# Patient Record
Sex: Female | Born: 1970 | Race: Black or African American | Hispanic: No | Marital: Married | Smoking: Current every day smoker
Health system: Southern US, Community
[De-identification: ages and names within clinical notes are randomized; demographics above are authoritative.]

## PROBLEM LIST (undated history)

## (undated) DIAGNOSIS — R55 Syncope and collapse: Secondary | ICD-10-CM

## (undated) DIAGNOSIS — E7800 Pure hypercholesterolemia, unspecified: Secondary | ICD-10-CM

## (undated) DIAGNOSIS — I10 Essential (primary) hypertension: Secondary | ICD-10-CM

---

## 1997-05-01 ENCOUNTER — Other Ambulatory Visit: Admission: RE | Admit: 1997-05-01 | Discharge: 1997-05-01 | Payer: Self-pay | Admitting: Obstetrics and Gynecology

## 1997-05-01 ENCOUNTER — Other Ambulatory Visit: Admission: RE | Admit: 1997-05-01 | Discharge: 1997-05-01 | Payer: Self-pay | Admitting: Gynecology

## 1997-05-24 ENCOUNTER — Other Ambulatory Visit: Admission: RE | Admit: 1997-05-24 | Discharge: 1997-05-24 | Payer: Self-pay | Admitting: Obstetrics & Gynecology

## 1998-01-02 ENCOUNTER — Ambulatory Visit (HOSPITAL_COMMUNITY): Admission: RE | Admit: 1998-01-02 | Discharge: 1998-01-02 | Payer: Self-pay | Admitting: Obstetrics and Gynecology

## 1998-01-04 ENCOUNTER — Ambulatory Visit (HOSPITAL_COMMUNITY): Admission: RE | Admit: 1998-01-04 | Discharge: 1998-01-04 | Payer: Self-pay | Admitting: Obstetrics and Gynecology

## 2003-01-22 ENCOUNTER — Emergency Department (HOSPITAL_COMMUNITY): Admission: EM | Admit: 2003-01-22 | Discharge: 2003-01-23 | Payer: Self-pay | Admitting: Emergency Medicine

## 2005-05-05 ENCOUNTER — Observation Stay (HOSPITAL_COMMUNITY): Admission: AD | Admit: 2005-05-05 | Discharge: 2005-05-06 | Payer: Self-pay | Admitting: Obstetrics and Gynecology

## 2005-12-07 ENCOUNTER — Inpatient Hospital Stay (HOSPITAL_COMMUNITY): Admission: AD | Admit: 2005-12-07 | Discharge: 2005-12-09 | Payer: Self-pay | Admitting: Obstetrics and Gynecology

## 2007-05-16 ENCOUNTER — Other Ambulatory Visit: Admission: RE | Admit: 2007-05-16 | Discharge: 2007-05-16 | Payer: Self-pay | Admitting: Gynecology

## 2009-05-01 ENCOUNTER — Ambulatory Visit (HOSPITAL_COMMUNITY): Admission: RE | Admit: 2009-05-01 | Discharge: 2009-05-01 | Payer: Self-pay | Admitting: Family Medicine

## 2010-01-28 ENCOUNTER — Ambulatory Visit: Admit: 2010-01-28 | Payer: Self-pay | Admitting: Obstetrics and Gynecology

## 2010-06-12 NOTE — H&P (Signed)
NAME:  Rachel, [name] NO.:  [id number]   MEDICAL RECORD NO.:  [id number]           PATIENT TYPE:  INP   LOCATION:  LDR4                          FACILITY:  [facility]   PHYSICIAN:  Tilda Burrow, M.D. DATE OF BIRTH:  08-21-1970   DATE OF ADMISSION:  DATE OF DISCHARGE:  LH                                HISTORY & PHYSICAL   CHIEF COMPLAINT:  Induction of labor secondary to maternal request due to  severe discomfort and cervical favorability.   HISTORY OF PRESENT ILLNESS:  Rachel [name] is a 40 year [name] gravida 5, para 1, AB 3,  with an EDC of December 07, 2005, based on two correlating first and second  trimester ultrasounds.  She began prenatal care in her first trimester and  has had regular visits since then.  Prenatal course was complicated by  severe hyperemesis gravidarum in the first trimester, losing 10% of her body  weight in the first few weeks.  That has since resolved and she has had a  total weight gain of 20 pounds throughout the pregnancy with appropriate  fundal height growth.  She did have a level II ultrasound at [facility]  for a question about the heart.  However, that was deemed to be within  normal limits.  Prenatal labs include blood type B+, rubella immune.  HBSAG,  HIV, RPR, gonorrhea, chlamydia and Group B strep are all negative.  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  Sickle screen was also negative.  Blood pressures have been 100 to 140s over  50s to 80s.   PAST MEDICAL HISTORY:  Noncontributory.   SURGICAL HISTORY:  D&C x2.   No known drug allergies.   No medications at this time.   SOCIAL HISTORY:  Is married.  Works for [employer].  Denies alcohol  or drug use.  Does admit to smoking a few cigarettes a day.   FAMILY HISTORY:  Positive for diabetes, hypertension and prostate cancer.   PHYSICAL EXAMINATION:  HEENT: Within normal limits.  HEART:  Regular rate and rhythm.  LUNGS:   Clear.  ABDOMEN:  Soft and nontender.  Fundal height about 40 cm.  Estimated fetal  weight about 7-1/2 to 8 pounds.  PELVIC:  Cervical exam 1-2 cm, 50% effaced, -2 station, vertex presentation.  EXTREMITIES:  Legs are negative.   IMPRESSION:  Intrauterine pregnancy at 40 weeks, elective induction of  labor.   PLAN:  Foley preinduction this evening with AROM and Pitocin in the morning.      Jacklyn Shell, C.N.M.      Tilda Burrow, M.D.  Electronically Signed    FC/MEDQ  D:  12/07/2005  T:  12/07/2005  Job:  [id number]

## 2010-06-12 NOTE — H&P (Signed)
[name], [name]               ACCOUNT NO.:  [id number]   MEDICAL RECORD NO.:  [id number]           PATIENT TYPE:  OIB   LOCATION:  A417                          FACILITY:  [facility]   PHYSICIAN:  Tilda Burrow, M.D. DATE OF BIRTH:  10-16-1970   DATE OF ADMISSION:  05/05/2005  DATE OF DISCHARGE:  LH                                HISTORY & PHYSICAL   REASON FOR ADMISSION:  Pregnancy at 9 weeks with severe nausea and vomiting  and dehydration.   HISTORY OF PRESENT ILLNESS:  Rachel [name] is a 40 year old Gravida 5, para 1,  abortus 3 who is in today for her Pap smear but complains of severe nausea  and vomiting.  She is down one pound from her last visit one week ago.  She  has 1+ protein, 2+ blood in her urine.  She is not able to tolerate anything  at the present time.   PAST MEDICAL HISTORY:  Medical history is positive for psoriasis.   PAST SURGICAL HISTORY:  Surgical history is positive for D&C x2.   ALLERGIES:  She has no known allergies.   FAMILY HISTORY:  Positive for diabetes, hypertension.   PHYSICAL EXAMINATION:  VITAL SIGNS:  Weight is 117 pounds, blood pressure  110/60.  There is 1+ protein and 2+ blood in her urine.  SPECULUM EXAM:  Cervix is closed.  No bleeding noted.   LABORATORY DATA:  Blood type is B positive.  Rubella is immune.  Initial  hemoglobin 12.6, initial WBC 6.8.  Hepatitis B surface antigen is negative.  HIV is negative.  Serologies are nonreactive.   PLAN:  1.  We are going to admit.  2.  IV hydration.  3.  Antiemetics.  4.  Probable discharge home in the morning if no complications.      Zerita Boers, Lanier Clam      Tilda Burrow, M.D.  Electronically Signed    DL/MEDQ  D:  16/10/9602  T:  05/05/2005  Job:  [id number]

## 2010-06-12 NOTE — Op Note (Signed)
[name], [name]               ACCOUNT NO.:  [id number]   MEDICAL RECORD NO.:  [id number]          PATIENT TYPE:  INP   LOCATION:  A402                          FACILITY:  [facility]   PHYSICIAN:  Lazaro Arms, M.D.   DATE OF BIRTH:  05/25/1970   DATE OF PROCEDURE:  02/10/2006  DATE OF DISCHARGE:  12/09/2005                               OPERATIVE REPORT   PROCEDURE:  Epidural placement.   Rachel [name] is a gravida 5, para 1, abortus 3 at [redacted] weeks gestation who  presented in spontaneous labor.  She has had Nubain and Phenergan and is  requesting an epidural be placed.   The patient is placed in the sitting position.  Betadine prep is used.  1% Lidocaine is injected in the L3-L4 interspace.  The area is sterilely  draped.  A 17 gauge Tuohy needle is used.  Loss of resistance technique  employed and the epidural space is found with one pass without  difficulty.  Ten mL of 0.125% bupivacaine plain is given into the  epidural space and the epidural catheter is then fed and taken down 5 cm  from the epidural space.  Continuous infusion is begun at 12 mL an hour  of 0.125% bupivacaine with 2 mcg per mL of Fentanyl.  The patient  tolerated the procedure well.  She is experiencing good pain relief.  Fetal heart rate tracing stable and blood pressure is stable.      Lazaro Arms, M.D.  Electronically Signed     LHE/MEDQ  D:  02/10/2006  T:  02/10/2006  Job:  [id number]

## 2010-06-12 NOTE — Group Therapy Note (Signed)
NAME:  ASEES, MANFREDI NO.:  [id number]   MEDICAL RECORD NO.:  [id number]          PATIENT TYPE:  INP   LOCATION:  LDR4                          FACILITY:  [facility]   PHYSICIAN:  Lazaro Arms, M.D.   DATE OF BIRTH:  [birth year]   DATE OF PROCEDURE:  DATE OF DISCHARGE:                                   PROGRESS NOTE   PROGRESS NOTE:  Pat progressed nicely through labor without Pitocin  augmentation and was noted be fully dilated and ready to push at  approximately 3:20.  She was having variable decels during each push;  however, returned to baseline with good variability in between contractions.  She pushed for about 20-30 minutes and had a spontaneous vaginal delivery of  a viable female infant at 13.  The mouth and nose were suctioned  thoroughly on the perineum with a DeLee suction.  The body delivered without  difficulty.  There was not a nuchal cord.  She was taken directed to the  warmer without stimulation and was suctioned endotracheally times with a 3.0  ET tube, collecting about 3 mL of meconium-stained fluid.  At that point the  baby had started to cry so DeLee suction was performed once more and regular  newborn care was given.  The baby does have what appears to be a external  pilonidal cyst versus open neural tube defect.  Dr. Milford Cage was notified and  care is being assumed by him.  The baby does move all of its lower  extremities in a normal fashion.  Apgars are nine and nine.  Weight is 6  pounds 7 ounces.  20 units of Pitocin diluted in 1000 mL of lactated  Ringer's was then infused rapidly IV.  The placenta separated spontaneously  and delivered via controlled cord traction at 0356.  It was inspected and  appears to be intact with a three-vessel cord.  The vagina was inspected and  no lacerations were found.  Estimated blood loss 200 mL.  The epidural  catheter was then removed with the blue tip visualized as being intact.      Jacklyn Shell, C.N.M.      Lazaro Arms, M.D.  Electronically Signed    FC/MEDQ  D:  12/08/2005  T:  12/08/2005  Job:  [id number]   cc:   Francoise Schaumann. Milford Cage DO, FAAP  Fax: [phone]

## 2012-12-19 ENCOUNTER — Other Ambulatory Visit (HOSPITAL_COMMUNITY): Payer: Self-pay | Admitting: Internal Medicine

## 2012-12-19 DIAGNOSIS — E049 Nontoxic goiter, unspecified: Secondary | ICD-10-CM

## 2012-12-26 ENCOUNTER — Ambulatory Visit (HOSPITAL_COMMUNITY): Payer: 59

## 2013-01-29 ENCOUNTER — Ambulatory Visit (HOSPITAL_COMMUNITY): Admission: RE | Admit: 2013-01-29 | Payer: 59 | Source: Ambulatory Visit

## 2016-11-10 ENCOUNTER — Other Ambulatory Visit: Payer: Self-pay | Admitting: Physician Assistant

## 2016-11-10 DIAGNOSIS — Z1231 Encounter for screening mammogram for malignant neoplasm of breast: Secondary | ICD-10-CM

## 2016-11-16 ENCOUNTER — Inpatient Hospital Stay: Admission: RE | Admit: 2016-11-16 | Payer: Self-pay | Source: Ambulatory Visit

## 2017-09-06 ENCOUNTER — Other Ambulatory Visit: Payer: Self-pay

## 2017-09-06 ENCOUNTER — Emergency Department (HOSPITAL_COMMUNITY)
Admission: EM | Admit: 2017-09-06 | Discharge: 2017-09-06 | Disposition: A | Discharge status: Home / Self Care | Payer: 59 | Attending: Emergency Medicine | Admitting: Emergency Medicine

## 2017-09-06 ENCOUNTER — Encounter (HOSPITAL_COMMUNITY): Payer: Self-pay | Admitting: *Deleted

## 2017-09-06 ENCOUNTER — Emergency Department (HOSPITAL_COMMUNITY): Payer: 59

## 2017-09-06 DIAGNOSIS — E876 Hypokalemia: Secondary | ICD-10-CM | POA: Diagnosis not present

## 2017-09-06 DIAGNOSIS — I10 Essential (primary) hypertension: Secondary | ICD-10-CM | POA: Insufficient documentation

## 2017-09-06 DIAGNOSIS — R079 Chest pain, unspecified: Secondary | ICD-10-CM | POA: Insufficient documentation

## 2017-09-06 DIAGNOSIS — F17210 Nicotine dependence, cigarettes, uncomplicated: Secondary | ICD-10-CM | POA: Diagnosis not present

## 2017-09-06 DIAGNOSIS — Z79899 Other long term (current) drug therapy: Secondary | ICD-10-CM | POA: Diagnosis not present

## 2017-09-06 DIAGNOSIS — E7800 Pure hypercholesterolemia, unspecified: Secondary | ICD-10-CM | POA: Insufficient documentation

## 2017-09-06 HISTORY — DX: Pure hypercholesterolemia, unspecified: E78.00

## 2017-09-06 HISTORY — DX: Syncope and collapse: R55

## 2017-09-06 HISTORY — DX: Essential (primary) hypertension: I10

## 2017-09-06 LAB — I-STAT BETA HCG BLOOD, ED (MC, WL, AP ONLY): I-stat hCG, quantitative: <5 m[IU]/mL (ref <5)

## 2017-09-06 LAB — I-STAT TROPONIN, ED
Troponin i, poc: 0 ng/mL (ref 0.00–0.08)
Troponin i, poc: 0.01 ng/mL (ref 0.00–0.08)

## 2017-09-06 LAB — CBC
HCT: 49.5 % — ABNORMAL HIGH (ref 36.0–46.0)
Hemoglobin: 17.2 g/dL — ABNORMAL HIGH (ref 12.0–15.0)
MCH: 33.1 pg (ref 26.0–34.0)
MCHC: 34.7 g/dL (ref 30.0–36.0)
MCV: 95.4 fL (ref 78.0–100.0)
Platelets: 298 10*3/uL (ref 150–400)
RBC: 5.19 MIL/uL — ABNORMAL HIGH (ref 3.87–5.11)
RDW: 12.7 % (ref 11.5–15.5)
WBC: 6.7 10*3/uL (ref 4.0–10.5)

## 2017-09-06 LAB — BASIC METABOLIC PANEL
Anion gap: 12 (ref 5–15)
BUN: 11 mg/dL (ref 6–20)
CO2: 27 mmol/L (ref 22–32)
Calcium: 10.3 mg/dL (ref 8.9–10.3)
Chloride: 98 mmol/L (ref 98–111)
Creatinine, Ser: 1.12 mg/dL — ABNORMAL HIGH (ref 0.44–1.00)
GFR calc Af Amer: >60 mL/min (ref >60)
GFR calc non Af Amer: 58 mL/min — ABNORMAL LOW (ref >60)
Glucose, Bld: 133 mg/dL — ABNORMAL HIGH (ref 70–99)
Potassium: 3.1 mmol/L — ABNORMAL LOW (ref 3.5–5.1)
Sodium: 137 mmol/L (ref 135–145)

## 2017-09-06 MED ORDER — ASPIRIN 81 MG PO CHEW
324.0000 mg | CHEWABLE_TABLET | Freq: Once | ORAL | Status: AC
  Administered 2017-09-06: 324 mg via ORAL
  Filled 2017-09-06: qty 4

## 2017-09-06 MED ORDER — POTASSIUM CHLORIDE ER 10 MEQ PO TBCR: 10.0000 meq | EXTENDED_RELEASE_TABLET | Freq: Two times a day (BID) | ORAL | 0 refills | Status: AC

## 2017-09-06 MED ORDER — POTASSIUM CHLORIDE ER 10 MEQ PO TBCR: 10.0000 meq | EXTENDED_RELEASE_TABLET | Freq: Two times a day (BID) | ORAL | 0 refills | Status: DC

## 2017-09-06 MED ORDER — POTASSIUM CHLORIDE CRYS ER 20 MEQ PO TBCR
40.0000 meq | EXTENDED_RELEASE_TABLET | Freq: Once | ORAL | Status: AC
  Administered 2017-09-06: 40 meq via ORAL
  Filled 2017-09-06: qty 2

## 2017-09-06 NOTE — ED Notes (Signed)
Pt states that she is ready to go home. EDP notified.

## 2017-09-06 NOTE — ED Notes (Signed)
EDP at bedside  

## 2017-09-06 NOTE — ED Triage Notes (Signed)
Pt c/o left sided chest pain, SOB, nausea, dizziness that started Friday. Pt reports her BP has also been elevated, SBP 170 at home, and has been compliant with medication. Pt reports the pain is worse in the morning and eases in the evening. Denies radiation of pain.

## 2017-09-06 NOTE — ED Notes (Signed)
EDP at bedside updating patient and family. 

## 2017-09-06 NOTE — ED Provider Notes (Signed)
[facility] Provider Note   CSN: [id number] Arrival date & time: 09/06/17  1359     History   Chief Complaint Chief Complaint  Patient presents with  . Chest Pain    HPI Rachel [name] is a 47 y.o. female.  HPI Patient presents to the emergency room for evaluation of chest pain, palpitations and some shortness of breath that started on Friday.  Patient states she is noticed episodes where she feels like her heart is beating irregularly.  She will also have a sensation that she needs to concentrate in order to breathe.  She has had constant aching in the center of her chest since Friday.  She is tried some antacid medications because she initially noticed some burping and belching but it has not really helped.  Pain seems to be worse in the morning and eases during the evening.  She tends to feel worse when she is sitting straight up.  Does not have any radiation of the pain.  She denies any leg swelling.  Patient does not have a history of heart disease but she does have high cholesterol, hypertension and she smokes.  Her father does have a history of heart disease in his 6940s to 4350s. Past Medical History:  Diagnosis Date  . High cholesterol   . Hypertension   . Vasovagal syncope     There are no active problems to display for this patient.   History reviewed. No pertinent surgical history.   OB History   None      Home Medications    Prior to Admission medications   Medication Sig Start Date End Date Taking? Authorizing Provider  amLODipine (NORVASC) 10 MG tablet amlodipine 10 mg tablet  TAKE 1 TABLET BY MOUTH EVERY DAY   Yes [provider]  losartan-hydrochlorothiazide (HYZAAR) 100-12.5 MG tablet losartan 100 mg-hydrochlorothiazide 12.5 mg tablet  TAKE 1 TABLET BY MOUTH  EVERY DAY   Yes [provider]  potassium chloride (K-DUR) 10 MEQ tablet Take 1 tablet (10 mEq total) by mouth 2 (two) times daily for 7 days. 09/06/17 09/13/17   Linwood DibblesKnapp, Kathrina Crosley, MD    Family History No family history on file.  Social History Social History   Tobacco Use  . Smoking status: Current Every Day Smoker    Packs/day: 1.00    Types: Cigarettes  . Smokeless tobacco: Never Used  Substance Use Topics  . Alcohol use: Not Currently    Comment: very rare  . Drug use: Yes    Types: Marijuana    Comment: last used 09/05/17     Allergies   Patient has no known allergies.   Review of Systems Review of Systems  All other systems reviewed and are negative.    Physical Exam Updated Vital Signs BP (!) 141/97   Pulse 65   Temp 97.9 F (36.6 C) (Oral)   Resp 15   Ht 1.6 m (5\' 3" )   Wt 52.6 kg   SpO2 99%   BMI 20.55 kg/m   Physical Exam  Constitutional: She appears well-developed and well-nourished. No distress.  HENT:  Head: Normocephalic and atraumatic.  Right Ear: External ear normal.  Left Ear: External ear normal.  Eyes: Conjunctivae are normal. Right eye exhibits no discharge. Left eye exhibits no discharge. No scleral icterus.  Neck: Neck supple. No tracheal deviation present.  Cardiovascular: Normal rate, regular rhythm and intact distal pulses.  Pulmonary/Chest: Effort normal and breath sounds normal. No stridor. No respiratory distress.  She has no wheezes. She has no rales.  Abdominal: Soft. Bowel sounds are normal. She exhibits no distension. There is no tenderness. There is no rebound and no guarding.  Musculoskeletal: She exhibits no edema or tenderness.  Neurological: She is alert. She has normal strength. No cranial nerve deficit (no facial droop, extraocular movements intact, no slurred speech) or sensory deficit. She exhibits normal muscle tone. She displays no seizure activity. Coordination normal.  Skin: Skin is warm and dry. No rash noted.  Psychiatric: She has a normal mood and affect.  Nursing note and vitals reviewed.    ED Treatments / Results  Labs (all labs ordered are listed, but only abnormal  results are displayed) Labs Reviewed  BASIC METABOLIC PANEL - Abnormal; Notable for the following components:      Result Value   Potassium 3.1 (*)    Glucose, Bld 133 (*)    Creatinine, Ser 1.12 (*)    GFR calc non Af Amer 58 (*)    All other components within normal limits  CBC - Abnormal; Notable for the following components:   RBC 5.19 (*)    Hemoglobin 17.2 (*)    HCT 49.5 (*)    All other components within normal limits  I-STAT BETA HCG BLOOD, ED ([facility], [facility], [facility] ONLY)  I-STAT TROPONIN, ED  I-STAT TROPONIN, ED    EKG EKG Interpretation  Date/Time:  Tuesday September 06 2017 14:09:27 EDT Ventricular Rate:  73 PR Interval:    QRS Duration: 91 QT Interval:  385 QTC Calculation: 425 R Axis:   54 Text Interpretation:  Sinus rhythm  Poor R wave progression No previous tracing Confirmed by Linwood Dibbles [phone]) on 09/06/2017 2:13:36 PM   Radiology Dg Chest 2 View  Result Date: 09/06/2017 CLINICAL DATA:  Chest pain EXAM: CHEST - 2 VIEW COMPARISON:  08/13/2009 FINDINGS: No edema or consolidation. Heart size and pulmonary vascularity are normal. No adenopathy. No bone lesions. No pneumothorax. IMPRESSION: No edema or consolidation. Electronically Signed   By: Bretta Bang III M.D.   On: 09/06/2017 15:08    Procedures Procedures (including critical care time)  Medications Ordered in ED Medications  aspirin chewable tablet 324 mg (324 mg Oral Given 09/06/17 1435)  potassium chloride SA (K-DUR,KLOR-CON) CR tablet 40 mEq (40 mEq Oral Given 09/06/17 1538)     Initial Impression / Assessment and Plan / ED Course  I have reviewed the triage vital signs and the nursing notes.  Pertinent labs & imaging results that were available during my care of the patient were reviewed by me and considered in my medical decision making (see chart for details).  Clinical Course as of Sep 07 1835  Tue Sep 06, 2017  1518 Initial labs show hypokalemia without other clinically significant  abdnormalities   [JK]  1519 CXR without acute finding   [JK]    Clinical Course User Index [JK] Linwood Dibbles, MD    Resented to the emergency room for evaluation of chest pain.  Patient symptoms are somewhat atypical.  She has also had some lightheadedness but has no focal neurologic symptoms and her vital signs are normal.  His pain is associated with palpitations EKG and heart rhythm here has been normal..  Patient is overall low risk heart score.  Delta troponins are negative.  EKG and x-ray are reassuring.  Patient does have mild hypokalemia I will have her take potassium supplements.  Patient has planned follow-up with her cardiologist on Friday.  Final Clinical Impressions(s) /  ED Diagnoses   Final diagnoses:  Chest pain, unspecified type  Hypokalemia    ED Discharge Orders         Ordered    potassium chloride (K-DUR) 10 MEQ tablet  2 times daily,   Status:  Discontinued     09/06/17 1833    potassium chloride (K-DUR) 10 MEQ tablet  2 times daily     09/06/17 1834           Linwood DibblesKnapp, Shaletta Hinostroza, MD 09/06/17 [id number]

## 2017-09-06 NOTE — Discharge Instructions (Addendum)
Will up with the cardiologist on Friday as planned, take the potassium as prescribed, return as needed for worsening symptoms

## 2018-02-02 ENCOUNTER — Ambulatory Visit (INDEPENDENT_AMBULATORY_CARE_PROVIDER_SITE_OTHER): Payer: 59 | Admitting: Otolaryngology

## 2018-08-22 ENCOUNTER — Other Ambulatory Visit: Payer: Self-pay

## 2018-08-22 DIAGNOSIS — Z20822 Contact with and (suspected) exposure to covid-19: Secondary | ICD-10-CM

## 2018-08-24 LAB — NOVEL CORONAVIRUS, NAA: SARS-CoV-2, NAA: NOT DETECTED

## 2018-08-28 ENCOUNTER — Other Ambulatory Visit: Payer: Self-pay | Admitting: Internal Medicine

## 2018-08-28 ENCOUNTER — Other Ambulatory Visit: Payer: Self-pay

## 2018-08-28 DIAGNOSIS — Z20822 Contact with and (suspected) exposure to covid-19: Secondary | ICD-10-CM

## 2018-08-29 LAB — NOVEL CORONAVIRUS, NAA: SARS-CoV-2, NAA: NOT DETECTED

## 2020-11-06 ENCOUNTER — Ambulatory Visit (INDEPENDENT_AMBULATORY_CARE_PROVIDER_SITE_OTHER): Payer: No Typology Code available for payment source

## 2020-11-06 ENCOUNTER — Other Ambulatory Visit: Payer: Self-pay

## 2020-11-06 ENCOUNTER — Ambulatory Visit
Admission: EM | Admit: 2020-11-06 | Discharge: 2020-11-07 | Disposition: A | Discharge status: Home / Self Care | Payer: No Typology Code available for payment source | Attending: Family Medicine | Admitting: Family Medicine

## 2020-11-06 DIAGNOSIS — S9032XA Contusion of left foot, initial encounter: Secondary | ICD-10-CM

## 2020-11-06 DIAGNOSIS — S9782XA Crushing injury of left foot, initial encounter: Secondary | ICD-10-CM

## 2020-11-06 MED ORDER — NAPROXEN 500 MG PO TABS: 500.0000 mg | ORAL_TABLET | Freq: Two times a day (BID) | ORAL | 0 refills | Status: AC | PRN

## 2020-11-06 NOTE — ED Triage Notes (Signed)
Pt presents with complaints of dropping a bench on her left foot. Limited range of motion with left foot.

## 2020-11-07 DIAGNOSIS — S9032XA Contusion of left foot, initial encounter: Secondary | ICD-10-CM | POA: Diagnosis not present

## 2020-11-10 NOTE — ED Provider Notes (Signed)
[facility]    CSN: [id number] Arrival date & time: 11/06/20  1930      History   Chief Complaint Chief Complaint  Patient presents with   Foot Injury    HPI Rachel [name] is a 50 y.o. female.   Presenting today with left foot pain, decreased ROM after dropping a bench onto the top of her foot a few hours ago. She has noticed some swelling but no discoloration, numbness, tingling, deformity. Difficult to bear weight due to extent of pain. So far has not tried anything OTC for sxs. No past hx of orthopedic injury to the area.    Past Medical History:  Diagnosis Date   High cholesterol    Hypertension    Vasovagal syncope     There are no problems to display for this patient.   History reviewed. No pertinent surgical history.  OB History   No obstetric history on file.      Home Medications    Prior to Admission medications   Medication Sig Start Date End Date Taking? Authorizing Provider  naproxen (NAPROSYN) 500 MG tablet Take 1 tablet (500 mg total) by mouth 2 (two) times daily as needed. 11/06/20  Yes Particia Nearing, PA-C  amLODipine (NORVASC) 10 MG tablet amlodipine 10 mg tablet  TAKE 1 TABLET BY MOUTH EVERY DAY    [provider]  losartan-hydrochlorothiazide (HYZAAR) 100-12.5 MG tablet losartan 100 mg-hydrochlorothiazide 12.5 mg tablet  TAKE 1 TABLET BY MOUTH  EVERY DAY    [provider]  potassium chloride (K-DUR) 10 MEQ tablet Take 1 tablet (10 mEq total) by mouth 2 (two) times daily for 7 days. 09/06/17 09/13/17  Linwood Dibbles, MD    Family History Family History  Family history unknown: Yes    Social History Social History   Tobacco Use   Smoking status: Every Day    Packs/day: 1.00    Types: Cigarettes   Smokeless tobacco: Never  Substance Use Topics   Alcohol use: Not Currently    Comment: very rare   Drug use: Yes    Types: Marijuana    Comment: last used 09/05/17     Allergies   Patient has no  known allergies.   Review of Systems Review of Systems PER HPI   Physical Exam Triage Vital Signs ED Triage Vitals  Enc Vitals Group     BP 11/06/20 1949 (!) 165/99     Pulse Rate 11/06/20 1949 93     Resp 11/06/20 1949 19     Temp 11/06/20 1949 99.2 F (37.3 C)     Temp src --      SpO2 11/06/20 1949 94 %     Weight --      Height --      Head Circumference --      Peak Flow --      Pain Score 11/06/20 1946 8     Pain Loc --      Pain Edu? --      Excl. in GC? --    No data found.  Updated Vital Signs BP (!) 165/99   Pulse 93   Temp 99.2 F (37.3 C)   Resp 19   SpO2 94%   Visual Acuity Right Eye Distance:   Left Eye Distance:   Bilateral Distance:    Right Eye Near:   Left Eye Near:    Bilateral Near:     Physical Exam Vitals and nursing note reviewed.  Constitutional:      Appearance: Normal appearance. She is not ill-appearing.  HENT:     Head: Atraumatic.  Eyes:     Extraocular Movements: Extraocular movements intact.     Conjunctiva/sclera: Conjunctivae normal.  Cardiovascular:     Rate and Rhythm: Normal rate and regular rhythm.     Heart sounds: Normal heart sounds.  Pulmonary:     Effort: Pulmonary effort is normal.     Breath sounds: Normal breath sounds.  Musculoskeletal:        General: Swelling, tenderness and signs of injury present. No deformity. Normal range of motion.     Cervical back: Normal range of motion and neck supple.     Comments: Left dorsal foot mildly edematous and significantly ttp. ROM intact but painful per patient. No palpable deformity. Able to bear weight but with significant limp  Skin:    General: Skin is warm and dry.  Neurological:     Mental Status: She is alert and oriented to person, place, and time.     Comments: Left foot neurovascularly intact  Psychiatric:        Mood and Affect: Mood normal.        Thought Content: Thought content normal.        Judgment: Judgment normal.     UC Treatments /  Results  Labs (all labs ordered are listed, but only abnormal results are displayed) Labs Reviewed - No data to display  EKG   Radiology No results found.  Procedures Procedures (including critical care time)  Medications Ordered in UC Medications - No data to display  Initial Impression / Assessment and Plan / UC Course  I have reviewed the triage vital signs and the nursing notes.  Pertinent labs & imaging results that were available during my care of the patient were reviewed by me and considered in my medical decision making (see chart for details).     Exam without red flag findings, left foot x-ray neg for bony abnormality. Will treat with naproxen, RICE. F/u for worsening sxs.  Final Clinical Impressions(s) / UC Diagnoses   Final diagnoses:  Contusion of left foot, initial encounter   Discharge Instructions   None    ED Prescriptions     Medication Sig Dispense Auth. Provider   naproxen (NAPROSYN) 500 MG tablet Take 1 tablet (500 mg total) by mouth 2 (two) times daily as needed. 30 tablet Particia Nearing, [place]      PDMP not reviewed this encounter.   Particia Nearing, [place] 11/10/20 2153

## 2022-04-26 IMAGING — DX DG FOOT COMPLETE 3+V*L*
3 series · 3 of 3 positions shown · non-contrast
Comparison: None.

CLINICAL DATA: Left foot crush injury, limited range of motion

EXAM:
LEFT FOOT - COMPLETE 3+ VIEW

[foot ap]
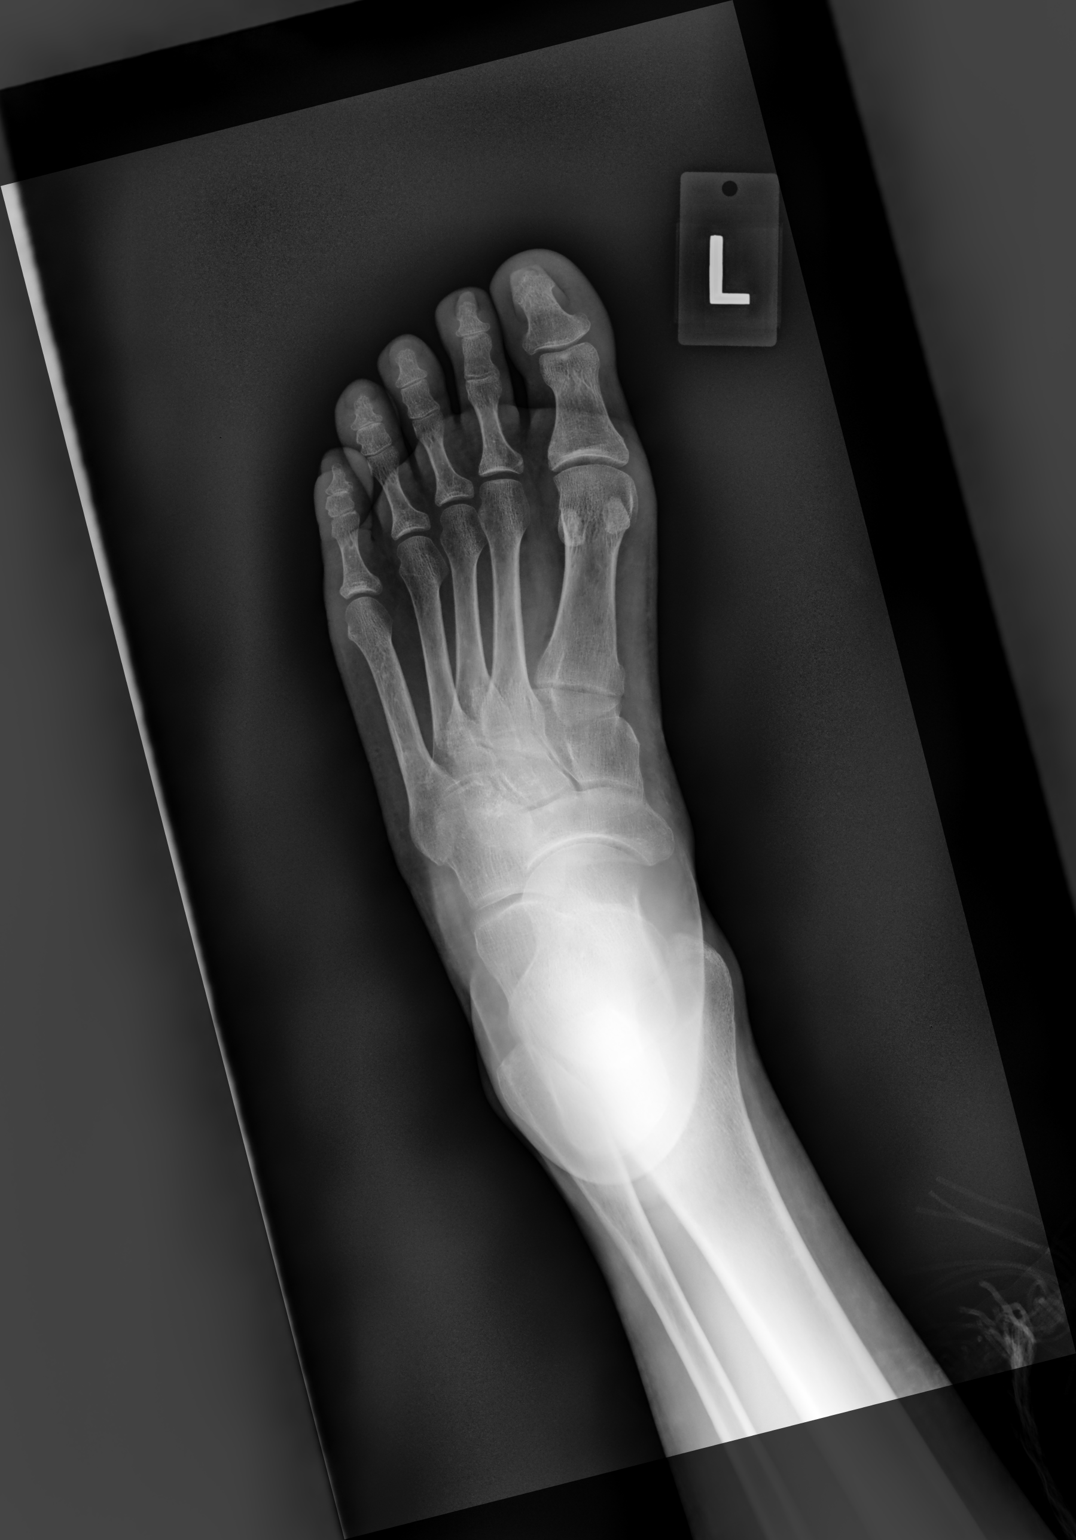

[foot mlo]
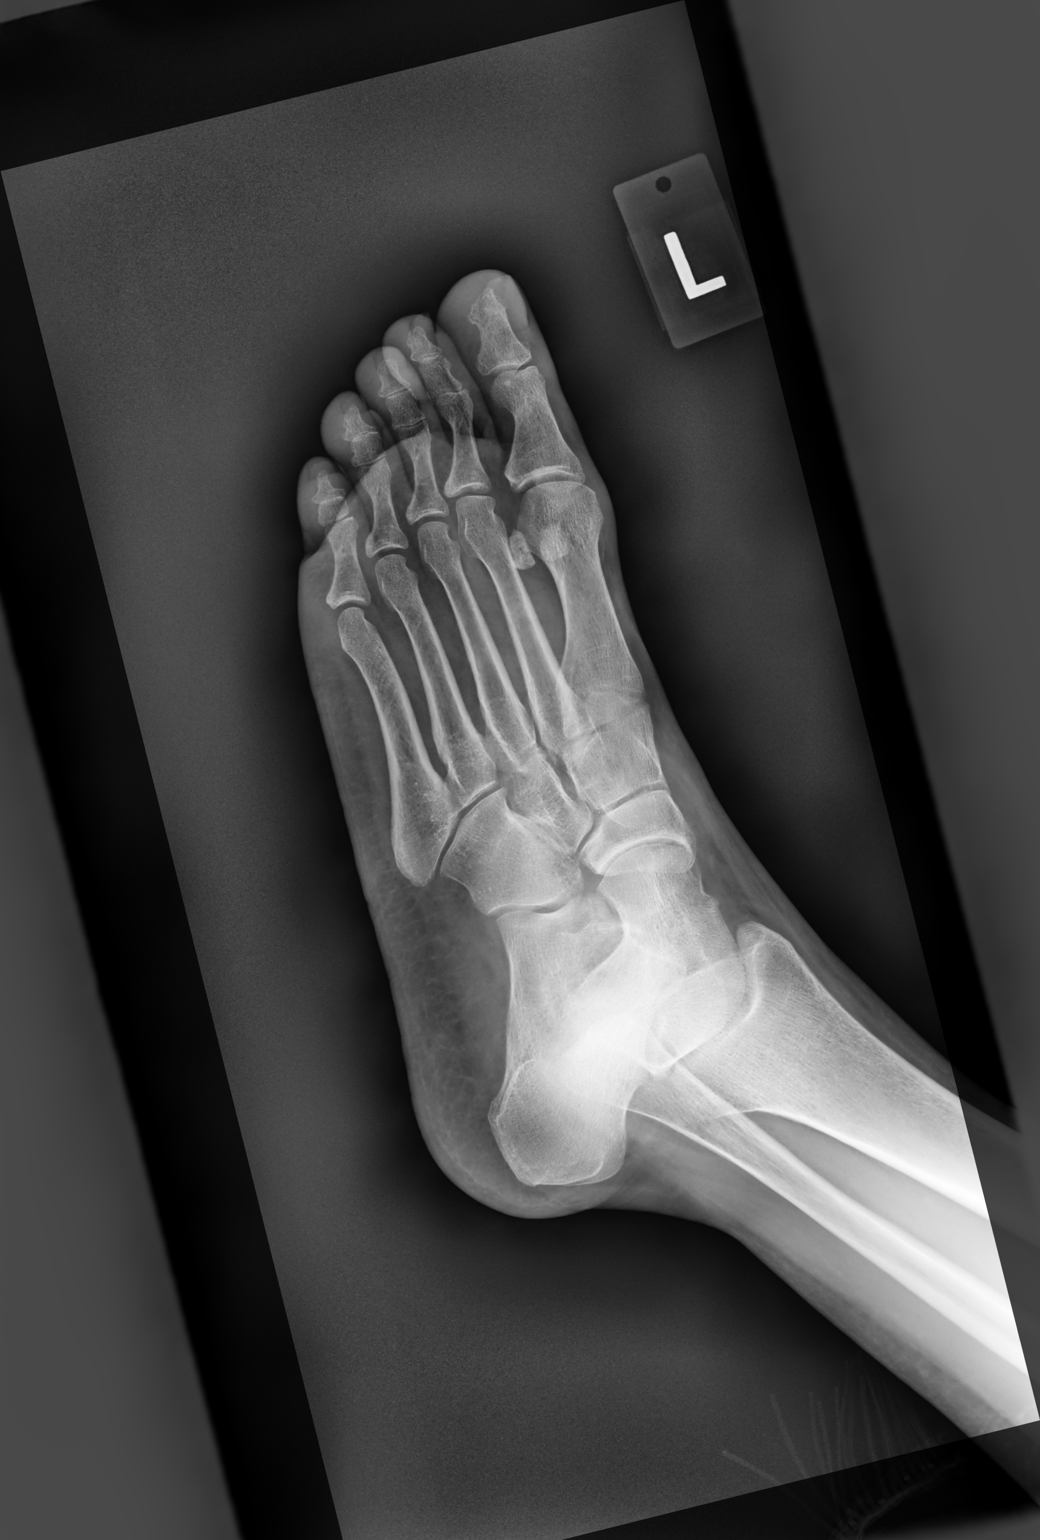

[foot lat]
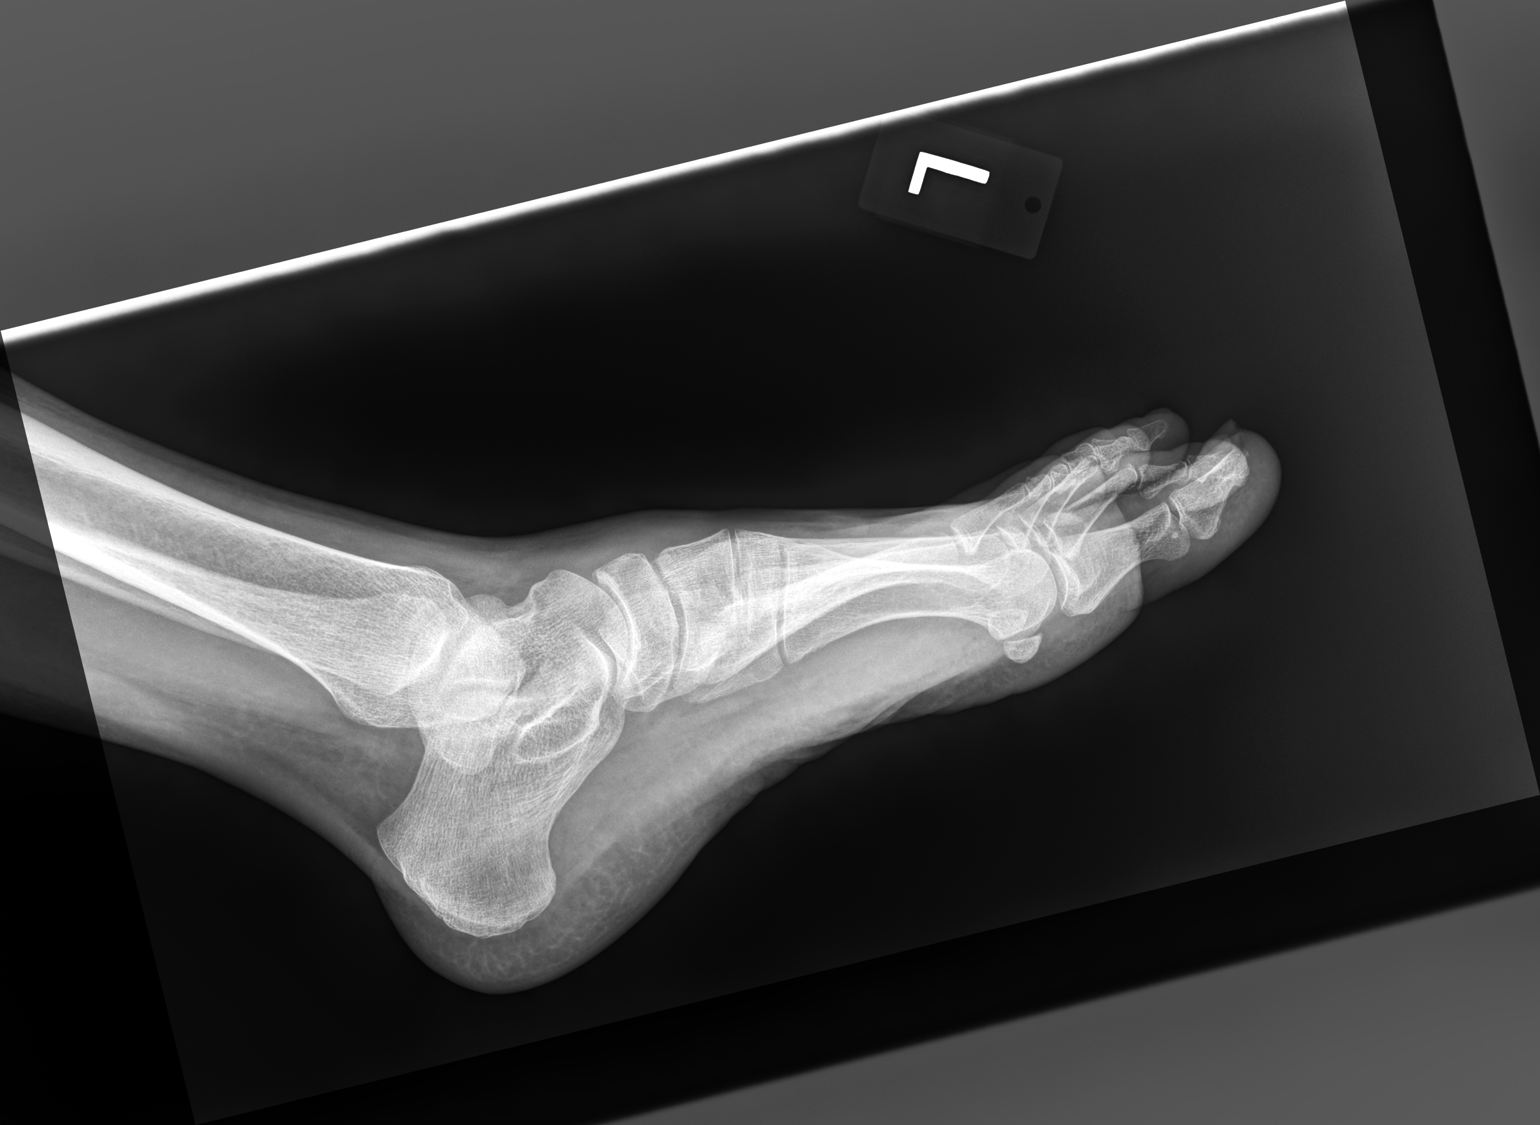

[3 of 3 positions shown; findings below may reference images not displayed]

FINDINGS: There is no evidence of fracture or dislocation. There is no
evidence of arthropathy or other focal bone abnormality. Mild soft
tissue swelling dorsal to the midfoot.
IMPRESSION: Soft tissue swelling.  No fracture or dislocation.

## 2022-06-25 ENCOUNTER — Encounter: Payer: Self-pay | Admitting: Emergency Medicine

## 2022-06-30 ENCOUNTER — Other Ambulatory Visit: Payer: Self-pay | Admitting: Emergency Medicine

## 2022-06-30 DIAGNOSIS — Z1231 Encounter for screening mammogram for malignant neoplasm of breast: Secondary | ICD-10-CM

## 2022-07-09 ENCOUNTER — Other Ambulatory Visit: Payer: Self-pay | Admitting: Family Medicine

## 2022-07-09 DIAGNOSIS — R234 Changes in skin texture: Secondary | ICD-10-CM

## 2022-07-16 ENCOUNTER — Other Ambulatory Visit: Payer: Self-pay | Admitting: Family Medicine

## 2022-07-16 DIAGNOSIS — R234 Changes in skin texture: Secondary | ICD-10-CM

## 2022-07-27 ENCOUNTER — Ambulatory Visit
Admission: RE | Admit: 2022-07-27 | Discharge: 2022-07-27 | Disposition: A | Discharge status: Home / Self Care | Payer: No Typology Code available for payment source | Source: Ambulatory Visit | Attending: Family Medicine | Admitting: Family Medicine

## 2022-07-27 DIAGNOSIS — R234 Changes in skin texture: Secondary | ICD-10-CM

## 2022-09-16 ENCOUNTER — Ambulatory Visit: Payer: No Typology Code available for payment source | Admitting: Family

## 2023-09-09 ENCOUNTER — Encounter: Payer: Self-pay | Admitting: Family Medicine

## 2023-09-09 DIAGNOSIS — Z1231 Encounter for screening mammogram for malignant neoplasm of breast: Secondary | ICD-10-CM

## 2023-10-03 ENCOUNTER — Other Ambulatory Visit: Payer: Self-pay

## 2023-10-03 DIAGNOSIS — Z1231 Encounter for screening mammogram for malignant neoplasm of breast: Secondary | ICD-10-CM

## 2023-12-08 ENCOUNTER — Other Ambulatory Visit: Payer: Self-pay

## 2023-12-08 DIAGNOSIS — Z1231 Encounter for screening mammogram for malignant neoplasm of breast: Secondary | ICD-10-CM
